# Patient Record
Sex: Male | Born: 1945 | Hispanic: No | Marital: Married | Smoking: Former smoker
Health system: Southern US, Community
[De-identification: ages and names within clinical notes are randomized; demographics above are authoritative.]

## PROBLEM LIST (undated history)

## (undated) DIAGNOSIS — R12 Heartburn: Secondary | ICD-10-CM

## (undated) DIAGNOSIS — R9431 Abnormal electrocardiogram [ECG] [EKG]: Secondary | ICD-10-CM

## (undated) DIAGNOSIS — I10 Essential (primary) hypertension: Secondary | ICD-10-CM

## (undated) DIAGNOSIS — E669 Obesity, unspecified: Secondary | ICD-10-CM

## (undated) DIAGNOSIS — E785 Hyperlipidemia, unspecified: Secondary | ICD-10-CM

## (undated) HISTORY — DX: Obesity, unspecified: E66.9

## (undated) HISTORY — DX: Abnormal electrocardiogram (ECG) (EKG): R94.31

## (undated) HISTORY — DX: Hyperlipidemia, unspecified: E78.5

## (undated) HISTORY — PX: OTHER SURGICAL HISTORY: SHX169

## (undated) HISTORY — DX: Heartburn: R12

## (undated) HISTORY — PX: LASIK: SHX215

## (undated) HISTORY — DX: Essential (primary) hypertension: I10

---

## 2009-02-20 ENCOUNTER — Encounter: Admission: RE | Admit: 2009-02-20 | Discharge: 2009-02-20 | Payer: Self-pay | Admitting: General Surgery

## 2009-02-27 ENCOUNTER — Ambulatory Visit (HOSPITAL_BASED_OUTPATIENT_CLINIC_OR_DEPARTMENT_OTHER): Admission: RE | Admit: 2009-02-27 | Discharge: 2009-02-27 | Payer: Self-pay | Admitting: General Surgery

## 2009-02-27 ENCOUNTER — Encounter (INDEPENDENT_AMBULATORY_CARE_PROVIDER_SITE_OTHER): Payer: Self-pay | Admitting: General Surgery

## 2010-09-25 IMAGING — CR DG CHEST 2V
2 series · 2 of 2 positions shown · non-contrast
Comparison: None

CLINICAL DATA: Preop for surgery on inflamed pilonidal cyst

CHEST - 2 VIEW

[w chest pa]
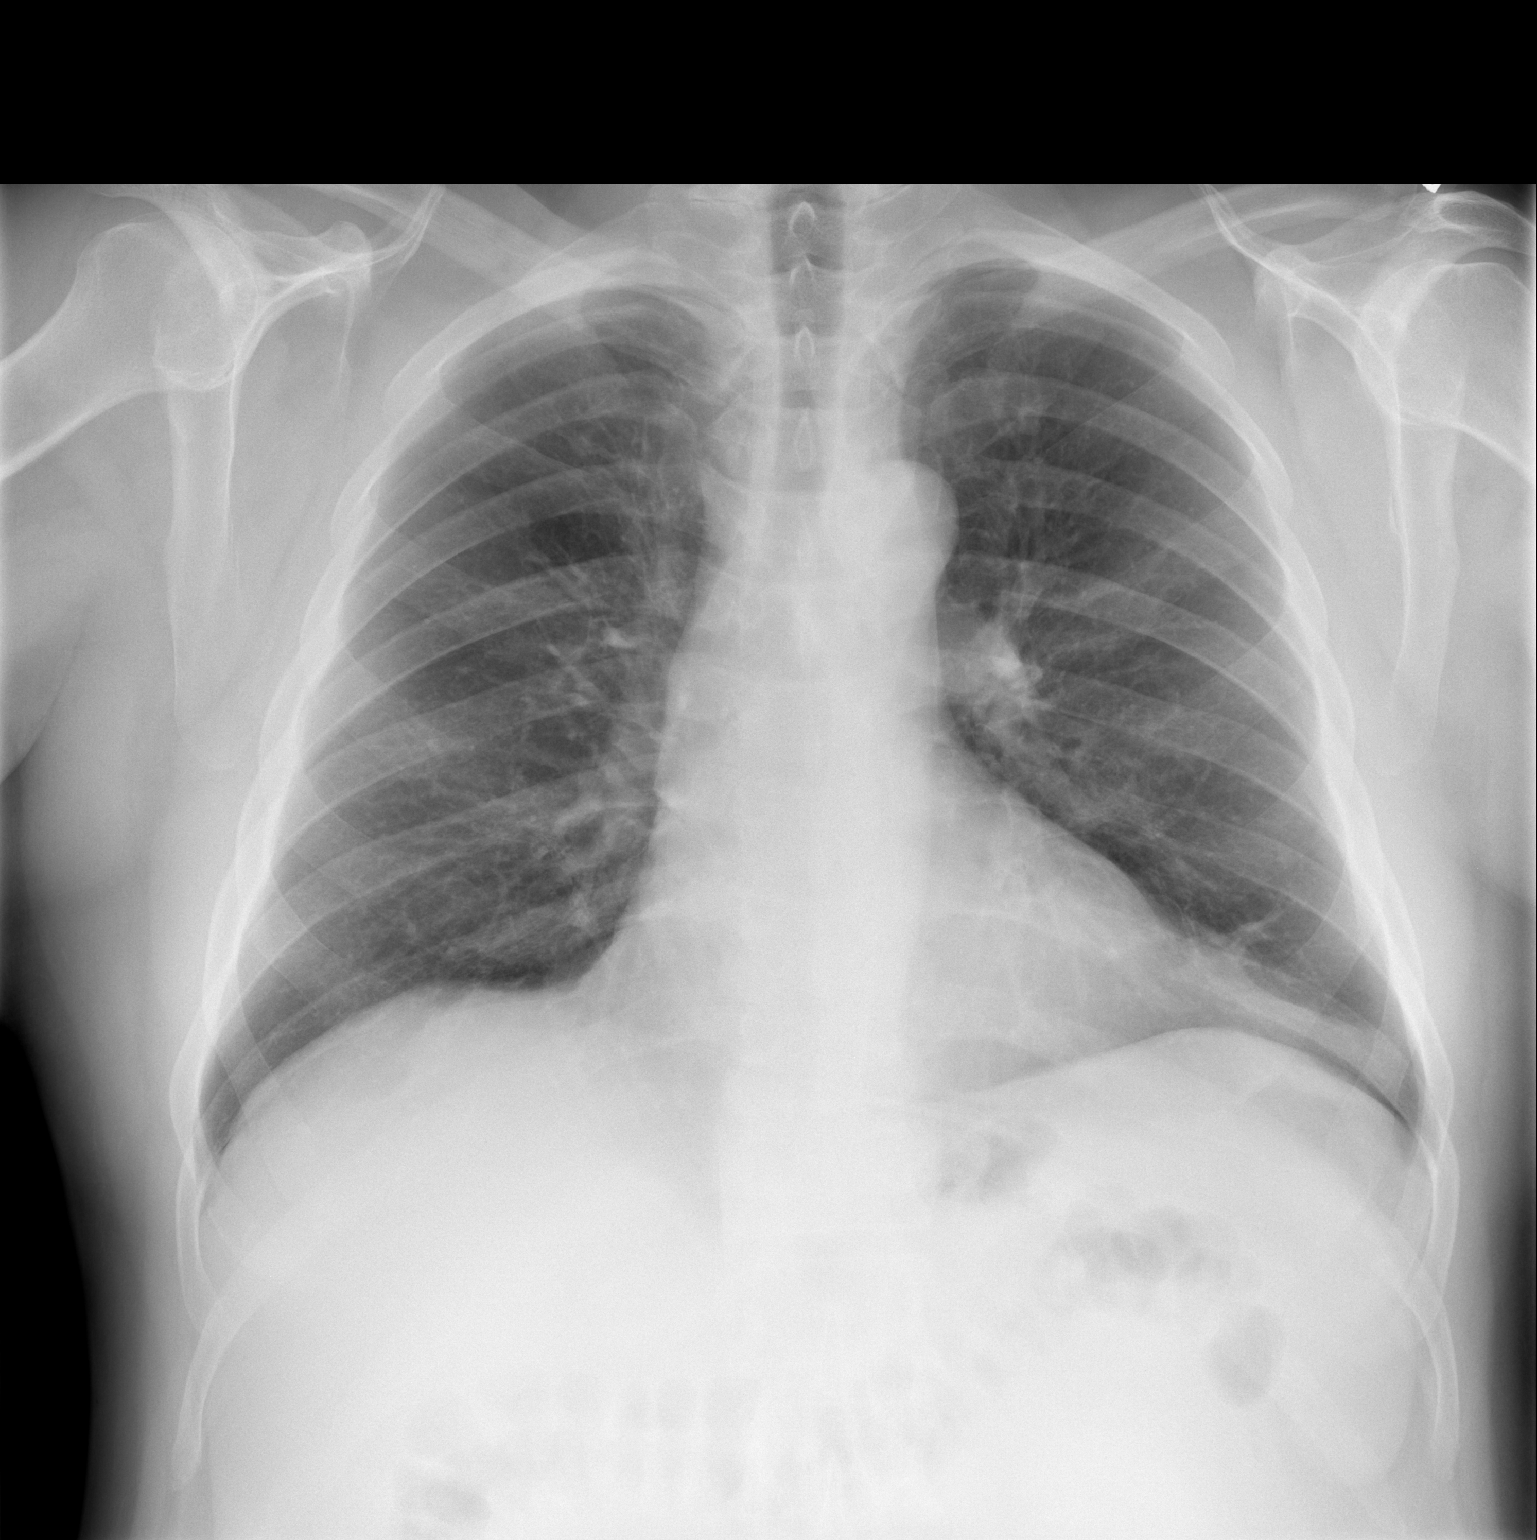

[w chest lat]
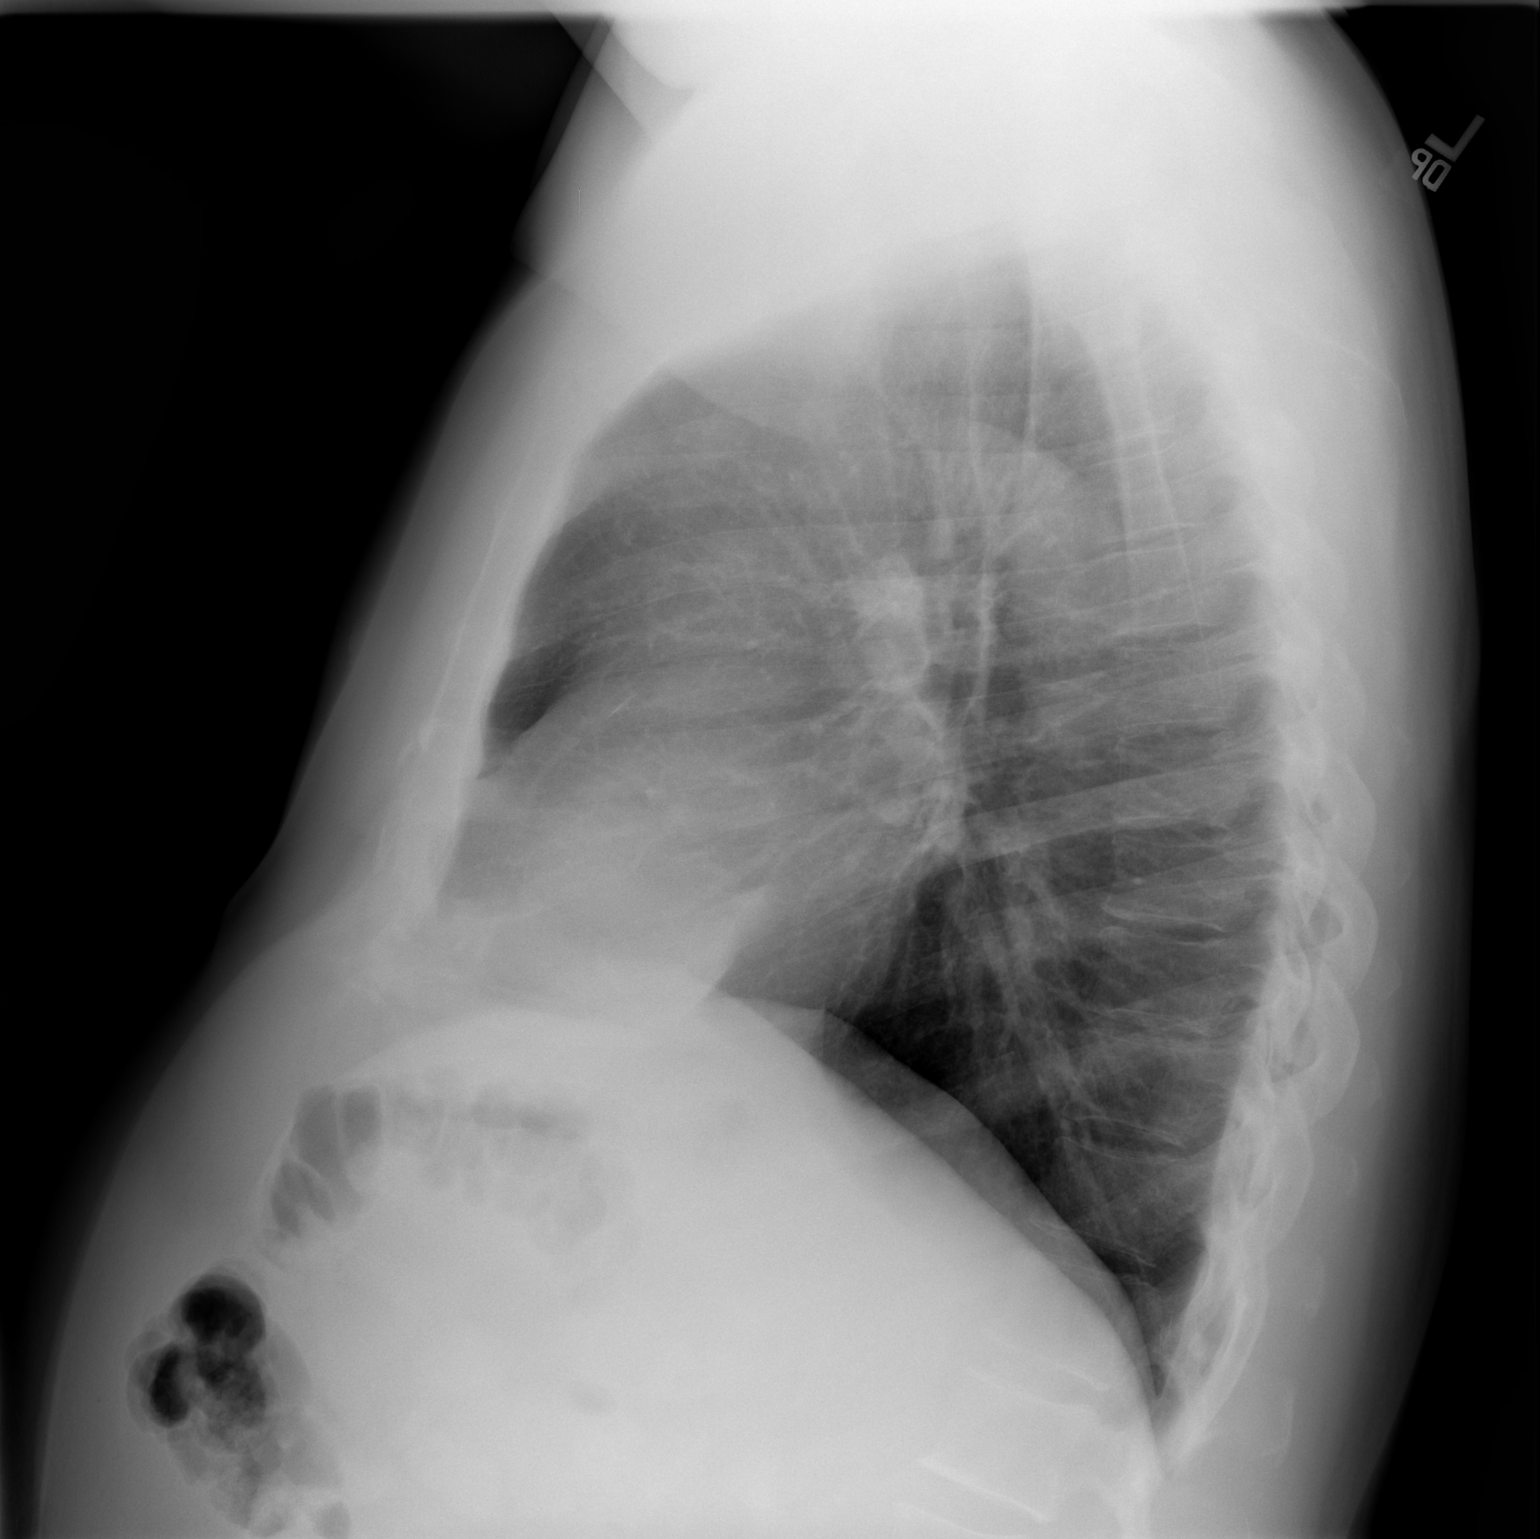

[2 of 2 positions shown; findings below may reference images not displayed]

FINDINGS: The lungs are clear. The heart is within normal limits in
size. No bony abnormality is seen.
IMPRESSION: No active lung disease.

## 2011-01-19 LAB — COMPREHENSIVE METABOLIC PANEL
ALT: 24 U/L (ref 0–53)
AST: 24 U/L (ref 0–37)
Albumin: 4 g/dL (ref 3.5–5.2)
CO2: 25 mEq/L (ref 19–32)
Chloride: 104 mEq/L (ref 96–112)
GFR calc Af Amer: >60 mL/min (ref >60)
GFR calc non Af Amer: >60 mL/min (ref >60)
Sodium: 136 mEq/L (ref 135–145)
Total Bilirubin: 0.6 mg/dL (ref 0.3–1.2)

## 2011-01-19 LAB — CBC
RBC: 4.78 MIL/uL (ref 4.22–5.81)
WBC: 6.6 10*3/uL (ref 4.0–10.5)

## 2011-01-19 LAB — DIFFERENTIAL
Basophils Absolute: 0 10*3/uL (ref 0.0–0.1)
Eosinophils Absolute: 0.2 10*3/uL (ref 0.0–0.7)
Eosinophils Relative: 3 % (ref 0–5)
Lymphs Abs: 2.4 10*3/uL (ref 0.7–4.0)
Monocytes Absolute: 0.5 10*3/uL (ref 0.1–1.0)

## 2011-02-23 NOTE — Op Note (Signed)
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   PROCEDURE:  Extensive pilonidal cystectomy.   SURGEON:  Adolph Pollack, M.D.   ANESTHESIA:  General plus Marcaine local.   INDICATIONS FOR PROCEDURE:  This is a 65 year old male who has had  recurring infections of pilonidal cyst.  He has had multiple incisions  and drainages.  He now presents for extensive pilonidal cystectomy.  The  procedure, risks and aftercare were explained to her preoperatively.   DESCRIPTION OF PROCEDURE:  He was brought to the operating room supine  on the stretcher was given a general anesthetic.  He was then turned  prone on the operating table with padding placed appropriately.  The  hair in the buttock area was clipped and the buttocks were retracted  laterally, exposing the diseased pilonidal area.  That area was  sterilely prepped and draped.   There was some purulent drainage and a firm indurated area to the right  of the gluteal cleft.  A large elliptical incision was made through the  skin and subcutaneous tissue, until I reached soft subcutaneous tissue.  This was carried down through subcutaneous tissue, all the away to the  to the border of the gluteus maximus muscle on the right, as the  induration went that far.  I used cautery to dissect down into  the  subcutaneous tissue to the presacral fascia, and then dissected this  large piece of inflamed and infected tissue free from the presacral  fascia and sent it to  pathology.  There was soft subcutaneous tissue,  without any evidence of further infection or inflammation noted.   I controlled bleeding with electrocautery and irrigated the wound and  waited and noted no further bleeding.  Local anesthetic consisting of  0.5% plain Marcaine was infiltrated superficially and deep.  The wound  was then packed tightly with saline-moistened Kerlix, followed by a  bulky dressing.   He was subsequently turned supine, extubated and taken to recovery room  in satisfactory condition.  There were no apparent complications.     Adolph Pollack, M.D.  Electronically Signed    TJR/MEDQ  D:  02/27/2009  T:  02/27/2009  Job:  [id number]

## 2013-07-19 ENCOUNTER — Encounter: Payer: Self-pay | Admitting: Interventional Cardiology

## 2013-07-24 ENCOUNTER — Encounter: Payer: Self-pay | Admitting: Interventional Cardiology

## 2013-07-24 ENCOUNTER — Ambulatory Visit (INDEPENDENT_AMBULATORY_CARE_PROVIDER_SITE_OTHER): Payer: Medicare Other | Admitting: Interventional Cardiology

## 2013-07-24 VITALS — BP 172/92 | HR 80 | Ht 69.25 in | Wt 252.0 lb

## 2013-07-24 DIAGNOSIS — Z79899 Other long term (current) drug therapy: Secondary | ICD-10-CM

## 2013-07-24 DIAGNOSIS — I10 Essential (primary) hypertension: Secondary | ICD-10-CM | POA: Insufficient documentation

## 2013-07-24 DIAGNOSIS — Z0389 Encounter for observation for other suspected diseases and conditions ruled out: Secondary | ICD-10-CM

## 2013-07-24 DIAGNOSIS — E669 Obesity, unspecified: Secondary | ICD-10-CM | POA: Insufficient documentation

## 2013-07-24 DIAGNOSIS — R9431 Abnormal electrocardiogram [ECG] [EKG]: Secondary | ICD-10-CM

## 2013-07-24 MED ORDER — LISINOPRIL 10 MG PO TABS: 10.0000 mg | ORAL_TABLET | Freq: Every day | ORAL | Status: DC

## 2013-07-24 NOTE — Patient Instructions (Signed)
Your physician has requested that you have an echocardiogram. Echocardiography is a painless test that uses sound waves to create images of your heart. It provides your doctor with information about the size and shape of your heart and how well your heart's chambers and valves are working. This procedure takes approximately one hour. There are no restrictions for this procedure.  Your physician recommends that you return for lab work in 1 week for Bmet.  Your physician has recommended you make the following change in your medication:   1.Start Lisinopril 10mg  1 tablet by mouth Daily.   Your physician recommends that you schedule a follow-up appointment in: 1 month with Dr. Eldridge Dace.

## 2013-07-24 NOTE — Progress Notes (Signed)
Patient ID: Darius Lane, male   DOB: Aug 08, 1946, 67 y.o.   MRN: [id number] Referring Physician  Dr. Miguel Aschoff,   Primary Cardiologist  Antwione Picotte-new Reason for Consultation  Abnormal ECG  HPI: 67 y/o who had an abnormal ecg several months ago.   He has felt well.  He hunts and walks several miles a week.  He walks with a large knapsack on as well.  He does not have to walk stairs.  He gets [facility] with walking hills.  He has gained about 50 lbs after stopping smoking a year ago.    No CP.  SHOB present.  He stopped cold [place], by distracting himself.  No dizziness, syncpoe, palpitations.  Edema at the end of the day.       PMH:   Past Medical History  Diagnosis Date  . Hyperlipidemia   . Heart burn      PSH:   Past Surgical History  Procedure Laterality Date  . Lasik    . Pilonidal      cyst  . Left knee      meniscal tear    Allergies:  Review of patient's allergies indicates no known allergies. Prior to Admit Meds:   (Not in a hospital admission) Fam HX:    Family History  Problem Relation Age of Onset  . Diabetes Mother   . Heart attack Father   . CAD Father    Social HX:    History   Social History  . Marital Status: Married    Spouse Name: N/A    Number of Children: N/A  . Years of Education: N/A   Occupational History  . Not on file.   Social History Main Topics  . Smoking status: Former Games developer  . Smokeless tobacco: Not on file  . Alcohol Use: Yes  . Drug Use: Not on file  . Sexual Activity: Not on file   Other Topics Concern  . Not on file   Social History Narrative  . No narrative on file     ROS:  All 11 ROS were addressed and are negative except what is stated in the HPI  Physical Exam: Blood pressure 172/92, pulse 80, height 5' 9.25" (1.759 m), weight 252 lb (114.306 kg).    General: Well developed, well nourished, in no acute distress Head: Eyes PERRLA, No xanthomas.   Normal cephalic and atramatic  Lungs:   Clear bilaterally to  auscultation and percussion. Heart:   HRRR S1 S2 Pulses are 2+ & equal.            No carotid bruit. No JVD.  No abdominal bruits. No femoral bruits. Abdomen: Bowel sounds are positive, abdomen soft and non-tender without masses or                  Hernia's noted. Msk:  Back normal, normal gait. Normal strength and tone for age. Extremities:   No  edema.  DP +1 Neuro: Alert and oriented X 3. Psych:  Normal affect, responds appropriately    Labs:   Lab Results  Component Value Date   WBC 6.6 02/20/2009   HGB 14.0 02/20/2009   HCT 42.0 02/20/2009   MCV 87.8 02/20/2009   PLT 188 02/20/2009   No results found for this basename: NA, K, CL, CO2, BUN, CREATININE, CALCIUM, LABALBU, PROT, BILITOT, ALKPHOS, ALT, AST, GLUCOSE,  in the last 168 hours No results found for this basename: PTT   No results found for this basename: INR,  PROTIME   No results found for this basename: CKTOTAL, CKMB, CKMBINDEX, TROPONINI     No results found for this basename: CHOL   No results found for this basename: HDL   No results found for this basename: LDLCALC   No results found for this basename: TRIG   No results found for this basename: CHOLHDL   No results found for this basename: LDLDIRECT      Radiology:  No results found.  EKG:    ASSESSMENT: Abnormal ECG, HTN, obesity, hyperlipidemia  PLAN:  Abnormal ECG: Plan for Echo to look at wall motion abnormalities.  COntinue aspirin. ECG shows inferior Q waves which could represent prior inferior MI. No chest discomfort with exertion at this time. Would hold off on stress testing.  HTN: start lisinopril 10 mg daily. Blood pressure has been elevated on several visits. Will check electrolytes in a week.  Hyperlipidemai: COntinue lipitor. He has had improvement with statin.  Obesity: He has gained weight since stopping smoking. He would benefit from weight loss. After this workup, I would like for him to try to get at least 150 minutes a week of  exercise. We spoke at length about dietary changes including avoiding sugary soft drinks and carbohydrate rich foods.  Corky Crafts., MD  07/24/2013  9:46 AM

## 2013-07-31 ENCOUNTER — Other Ambulatory Visit: Payer: Medicare Other

## 2013-07-31 DIAGNOSIS — Z79899 Other long term (current) drug therapy: Secondary | ICD-10-CM

## 2013-07-31 LAB — BASIC METABOLIC PANEL WITH GFR
Calcium: 9.5 mg/dL (ref 8.4–10.5)
Creat: 1.29 mg/dL (ref 0.50–1.35)
GFR, Est African American: 66 mL/min
Glucose, Bld: 92 mg/dL (ref 70–99)
Sodium: 135 mEq/L (ref 135–145)

## 2013-08-07 ENCOUNTER — Other Ambulatory Visit (HOSPITAL_COMMUNITY): Payer: Self-pay | Admitting: Interventional Cardiology

## 2013-08-07 ENCOUNTER — Other Ambulatory Visit: Payer: Self-pay

## 2013-08-07 ENCOUNTER — Ambulatory Visit (HOSPITAL_COMMUNITY): Payer: Medicare Other | Attending: Cardiology | Admitting: Radiology

## 2013-08-07 DIAGNOSIS — E785 Hyperlipidemia, unspecified: Secondary | ICD-10-CM | POA: Insufficient documentation

## 2013-08-07 DIAGNOSIS — E669 Obesity, unspecified: Secondary | ICD-10-CM | POA: Insufficient documentation

## 2013-08-07 DIAGNOSIS — R9431 Abnormal electrocardiogram [ECG] [EKG]: Secondary | ICD-10-CM | POA: Insufficient documentation

## 2013-08-07 NOTE — Progress Notes (Signed)
Echocardiogram performed.  

## 2013-08-28 ENCOUNTER — Ambulatory Visit (INDEPENDENT_AMBULATORY_CARE_PROVIDER_SITE_OTHER): Payer: Medicare Other | Admitting: Interventional Cardiology

## 2013-08-28 ENCOUNTER — Encounter: Payer: Self-pay | Admitting: Interventional Cardiology

## 2013-08-28 VITALS — BP 140/88 | HR 92 | Ht 69.25 in | Wt 242.0 lb

## 2013-08-28 DIAGNOSIS — E669 Obesity, unspecified: Secondary | ICD-10-CM

## 2013-08-28 DIAGNOSIS — E782 Mixed hyperlipidemia: Secondary | ICD-10-CM

## 2013-08-28 DIAGNOSIS — I10 Essential (primary) hypertension: Secondary | ICD-10-CM

## 2013-08-28 DIAGNOSIS — R9431 Abnormal electrocardiogram [ECG] [EKG]: Secondary | ICD-10-CM

## 2013-08-28 NOTE — Patient Instructions (Signed)
Your physician recommends that you schedule a follow-up appointment as needed  

## 2013-08-28 NOTE — Progress Notes (Signed)
Patient ID: Darius Lane, male   DOB: [birth year], 67 y.o.   MRN: [id number] Patient ID: Darius Lane, male   DOB: 1946-02-06, 67 y.o.   MRN: [id number]    [address] [place], [place]  [address] Phone: [phone] Fax:  [phone]  Date:  08/28/2013   ID:  Darius Lane, DOB 25-Jul-1946, MRN [id number]  PCP:   Duane Lope, MD      History of Present Illness: Darius Lane is a 67 y.o. male who had an abnormal ecg several months ago.   He has felt well.  He hunts and walks several miles a week.  He walks with a large knapsack on as well.  He does not have to walk stairs.  He gets [facility] with walking hills.  He has gained about 50 lbs after stopping smoking a year ago.    No CP.  SHOB present.  He stopped cold [place], by distracting himself.  No dizziness, syncpoe, palpitations.  Edema at the end of the day.    His BP was up.  Lisinopril was started but BP states in the 150-170 range systolic.  He had bronchitis and saw Dr. Tenny Craw yesterday.  BP medicine was changed to amlodipine/benazepril.  BP was better yesterday.     Wt Readings from Last 3 Encounters:  08/28/13 242 lb (109.77 kg)  07/24/13 252 lb (114.306 kg)     Past Medical History  Diagnosis Date  . Hyperlipidemia   . Heart burn   . Essential hypertension, benign   . Nonspecific abnormal electrocardiogram (ECG) (EKG)     Wide QRS with inferior Q waves  . Obesity, unspecified     Current Outpatient Prescriptions  Medication Sig Dispense Refill  . amLODipine-benazepril (LOTREL) 5-20 MG per capsule Take 1 capsule by mouth daily.       Marland Kitchen aspirin 81 MG tablet Take 81 mg by mouth daily.      Marland Kitchen atorvastatin (LIPITOR) 40 MG tablet Take 40 mg by mouth daily.      Marland Kitchen azithromycin (ZITHROMAX) 250 MG tablet Take 250 mg by mouth once.       . ranitidine (ZANTAC) 150 MG tablet Take 150 mg by mouth 2 (two) times daily.        No current facility-administered medications for this visit.    Allergies:   No Known  Allergies  Social History:  The patient  reports that he has quit smoking. He does not have any smokeless tobacco history on file. He reports that he drinks alcohol.   Family History:  The patient's family history includes CAD in his father; Diabetes in his mother; Heart attack in his father.   ROS:  Please see the history of present illness.  No nausea, vomiting.  No fevers, chills.  No focal weakness.  No dysuria.    All other systems reviewed and negative.   PHYSICAL EXAM: VS:  BP 140/88  Pulse 92  Ht 5' 9.25" (1.759 m)  Wt 242 lb (109.77 kg)  BMI 35.48 kg/m2 Well nourished, well developed, in no acute distress HEENT: normal Neck: no JVD, no carotid bruits Cardiac:  normal S1, S2; RRR;  Lungs:  clear to auscultation bilaterally, no wheezing, rhonchi or rales Abd: soft, nontender, no hepatomegaly Ext: no edema Skin: warm and dry Neuro:   no focal abnormalities noted  EKG:      ASSESSMENT AND PLAN:  Abnormal ECG:  Echo showed no RWMA. COntinue aspirin. ECG shows  inferior Q waves. No chest discomfort with exertion at this time. Would hold off on stress testing.   HTN:  lisinopril 10 mg daily gave no significant BP reduction. Blood pressure has been elevated on several visits. Continue lotrel .   Hyperlipidemia: COntinue lipitor. He has had improvement with statin. LDL target less than 130.  Obesity: He has gained weight since stopping smoking. He would benefit from weight loss. After this workup, I would like for him to try to get at least 150 minutes a week of exercise. We spoke at length about dietary changes including avoiding sugary soft drinks and carbohydrate rich foods.  He has lost 10 lbs since the last visit.   Signed, Fredric Mare, MD, [facility] 08/28/2013 11:01 AM

## 2013-08-29 DIAGNOSIS — E782 Mixed hyperlipidemia: Secondary | ICD-10-CM | POA: Insufficient documentation

## 2018-10-18 ENCOUNTER — Other Ambulatory Visit: Payer: Self-pay | Admitting: Family Medicine

## 2018-10-18 DIAGNOSIS — Z136 Encounter for screening for cardiovascular disorders: Secondary | ICD-10-CM

## 2018-10-27 ENCOUNTER — Ambulatory Visit
Admission: RE | Admit: 2018-10-27 | Discharge: 2018-10-27 | Disposition: A | Discharge status: Home / Self Care | Payer: Medicare Other | Source: Ambulatory Visit | Attending: Family Medicine | Admitting: Family Medicine

## 2018-10-27 DIAGNOSIS — Z136 Encounter for screening for cardiovascular disorders: Secondary | ICD-10-CM

## 2019-12-07 ENCOUNTER — Ambulatory Visit: Payer: Medicare Other | Attending: Internal Medicine

## 2019-12-07 DIAGNOSIS — Z23 Encounter for immunization: Secondary | ICD-10-CM

## 2019-12-07 NOTE — Progress Notes (Signed)
   Covid-19 Vaccination Clinic  Name:  Darius Lane    MRN: [id number] DOB: 19-Mar-1946  12/07/2019  [name] was observed post Covid-19 immunization for 15 minutes without incidence. He was provided with Vaccine Information Sheet and instruction to access the V-Safe system.   [name] was instructed to call 911 with any severe reactions post vaccine: Marland Kitchen Difficulty breathing  . Swelling of your face and throat  . A fast heartbeat  . A bad rash all over your body  . Dizziness and weakness    Immunizations Administered    Name Date Dose VIS Date Route   Pfizer COVID-19 Vaccine 12/07/2019 11:46 AM 0.3 mL 09/21/2019 Intramuscular   Manufacturer: [place]   Lot: [id number]   [place]: [id number]

## 2020-01-01 ENCOUNTER — Ambulatory Visit: Payer: Medicare Other | Attending: Internal Medicine

## 2020-01-01 DIAGNOSIS — Z23 Encounter for immunization: Secondary | ICD-10-CM

## 2020-01-01 NOTE — Progress Notes (Signed)
   Covid-19 Vaccination Clinic  Name:  Darius Lane    MRN: [id number] DOB: 12/06/1945  01/01/2020  Darius Lane was observed post Covid-19 immunization for 15 minutes without incident. He was provided with Vaccine Information Sheet and instruction to access the V-Safe system.   Darius Lane was instructed to call 911 with any severe reactions post vaccine: Marland Kitchen Difficulty breathing  . Swelling of face and throat  . A fast heartbeat  . A bad rash all over body  . Dizziness and weakness   Immunizations Administered    Name Date Dose VIS Date Route   Pfizer COVID-19 Vaccine 01/01/2020  2:15 PM 0.3 mL 09/21/2019 Intramuscular   Manufacturer: [place]   Lot: [id number]   [place]: [id number]

## 2020-05-31 IMAGING — US US ABDOMINAL AORTA SCREENING AAA
1 series · 14 of 14 positions shown · non-contrast
Comparison: None.

CLINICAL DATA: Male between 65-75 years of age with a smoking
history.

EXAM:
US ABDOMINAL AORTA MEDICARE SCREENING
TECHNIQUE: Ultrasound examination of the abdominal aorta was performed as a
screening evaluation for abdominal aortic aneurysm.

[Series 1: us abdominal aorta screening aaa · 0.33mm/px · 14 of 14 slices shown]
[im 1/14]
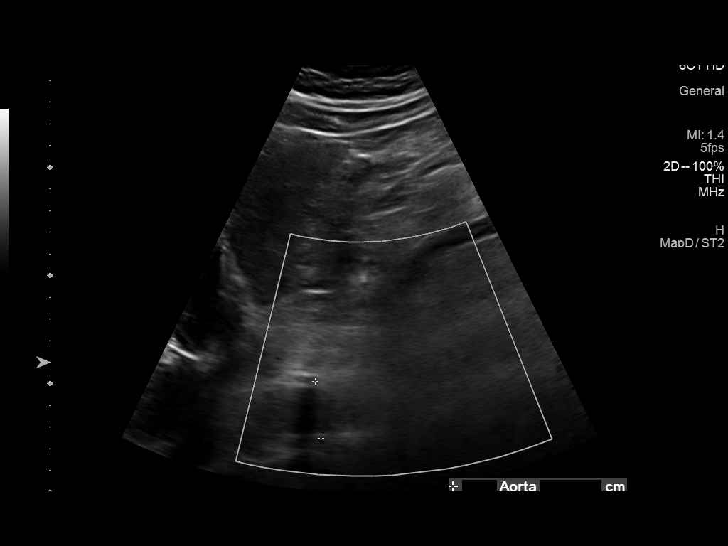
[im 2/14]
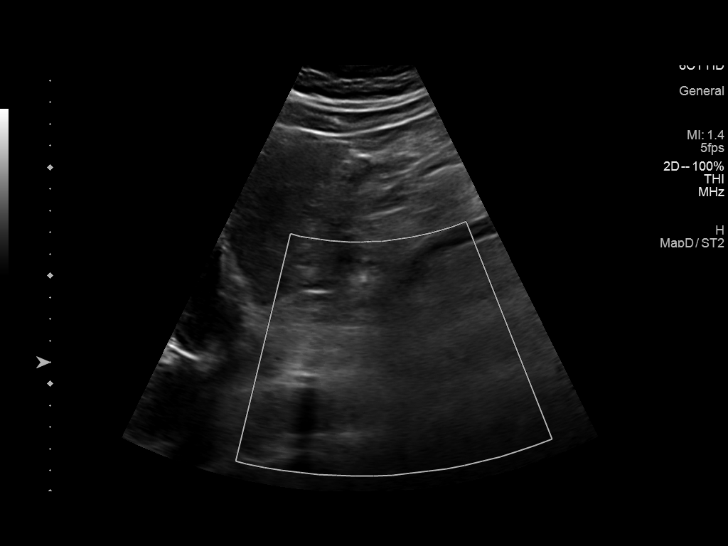
[im 3/14]
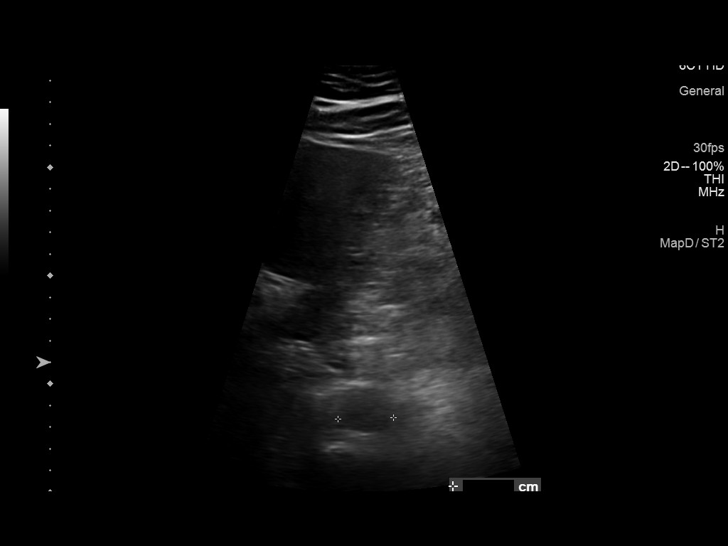
[im 4/14]
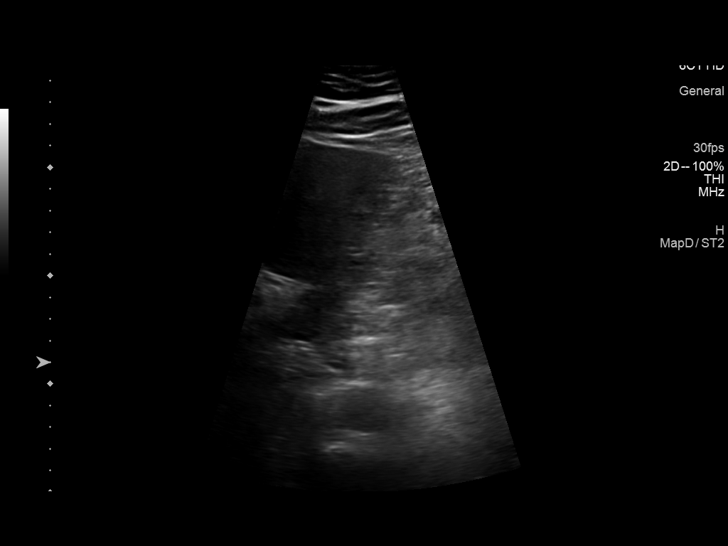
[im 5/14]
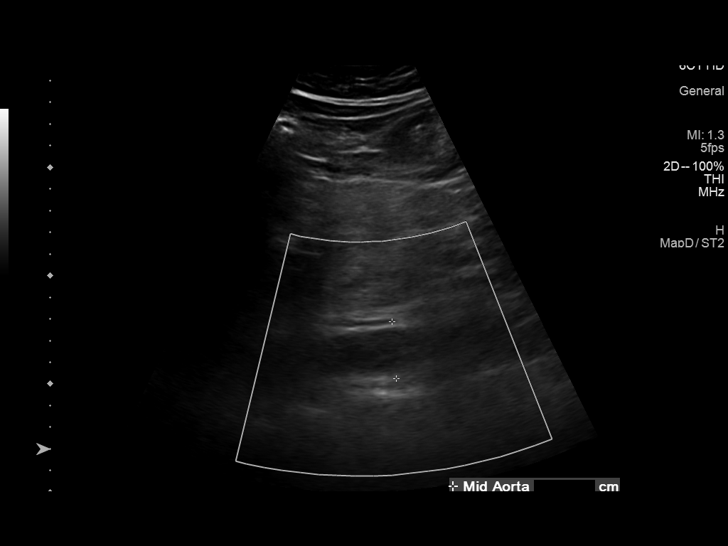
[im 6/14]
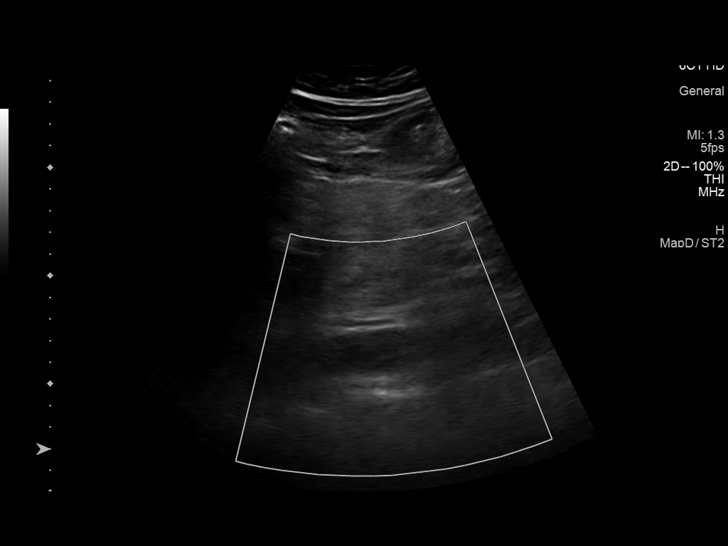
[im 7/14]
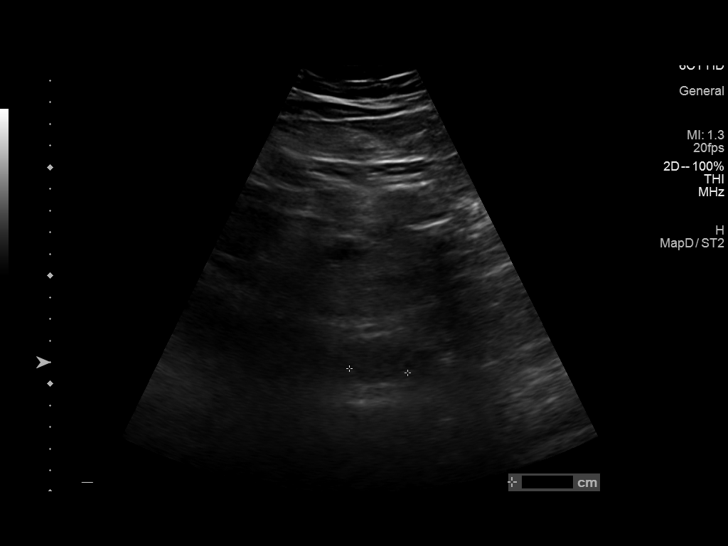
[im 8/14]
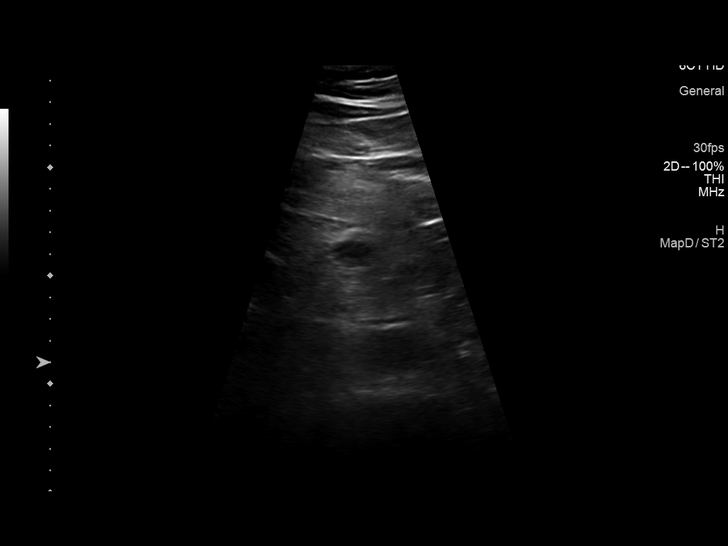
[im 9/14]
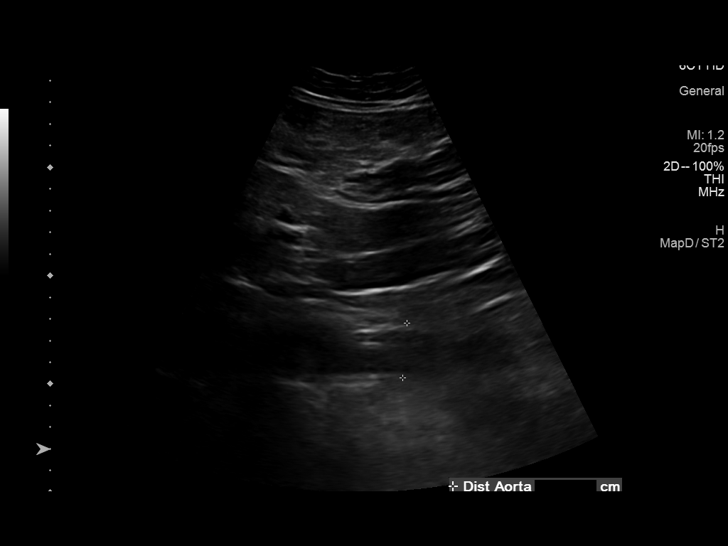
[im 10/14]
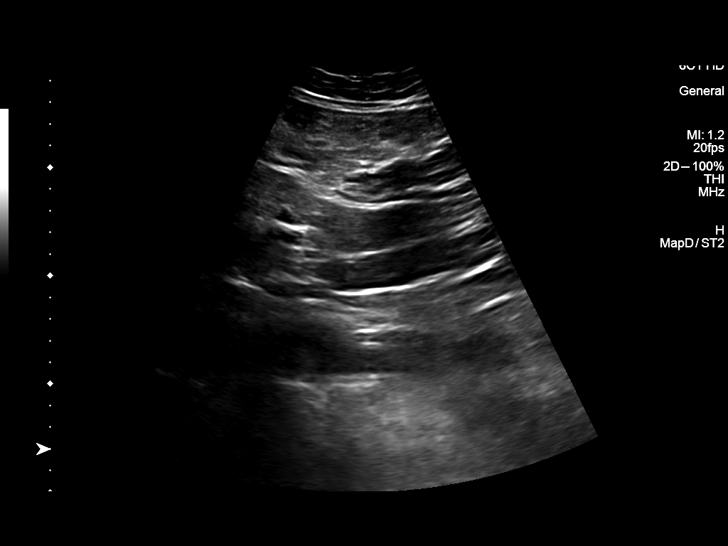
[im 11/14]
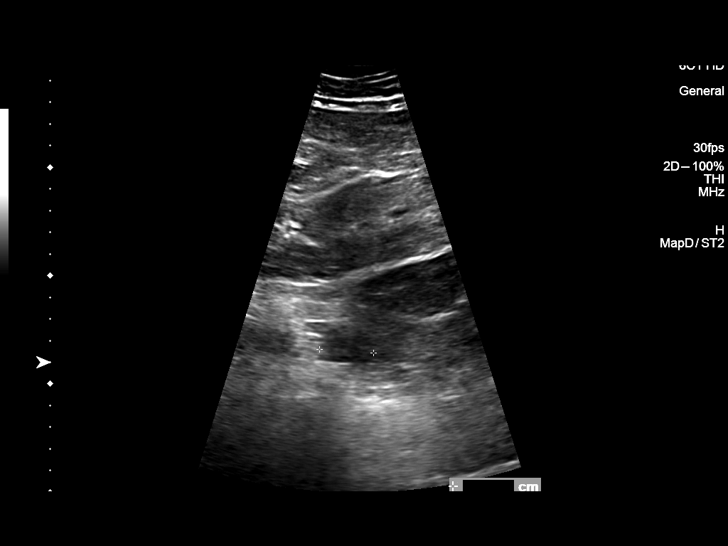
[im 12/14]
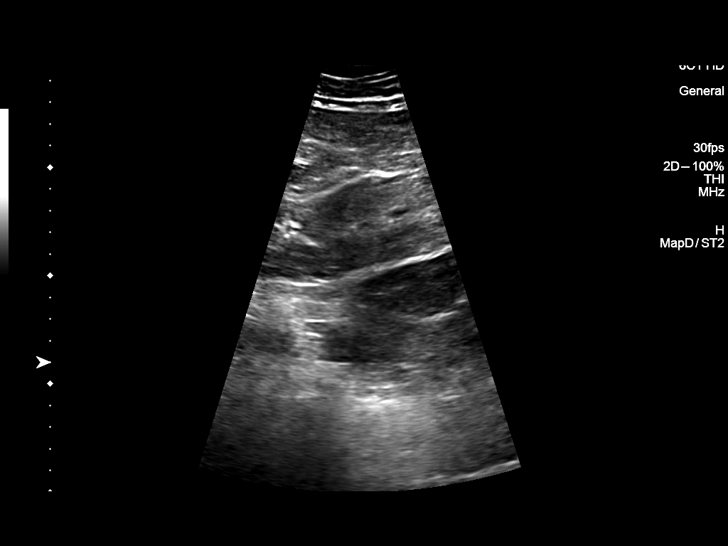
[im 13/14]
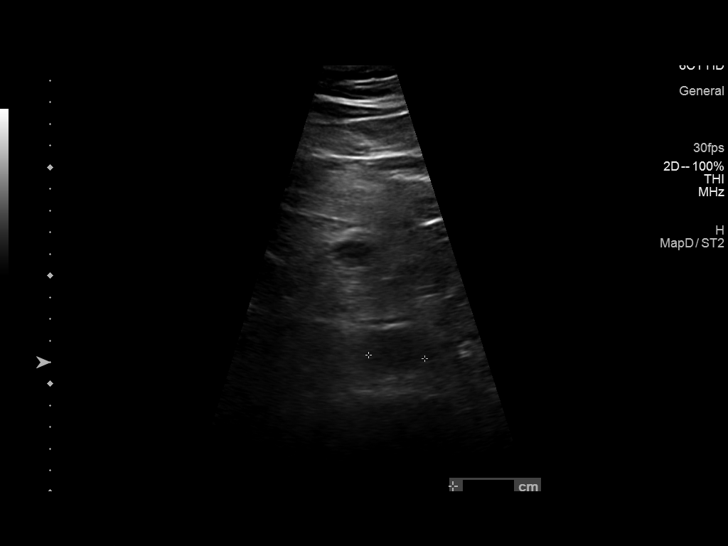
[im 14/14]
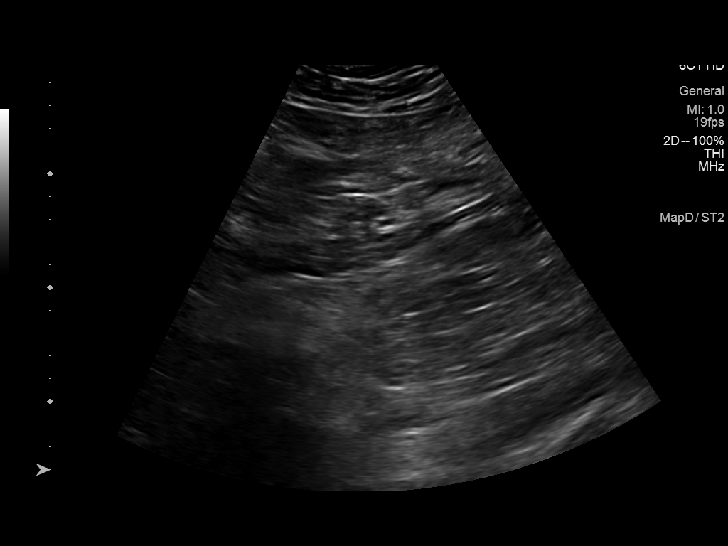

[14 of 14 positions shown; findings below may reference images not displayed]

FINDINGS: Abdominal aortic measurements as follows:

Proximal:  2.6 cm

Mid:  2.7 cm

Distal:  2.5 cm
IMPRESSION: No aneurysm identified.

## 2020-10-21 DIAGNOSIS — N183 Chronic kidney disease, stage 3 unspecified: Secondary | ICD-10-CM | POA: Diagnosis not present

## 2020-10-21 DIAGNOSIS — I10 Essential (primary) hypertension: Secondary | ICD-10-CM | POA: Diagnosis not present

## 2020-10-21 DIAGNOSIS — E7800 Pure hypercholesterolemia, unspecified: Secondary | ICD-10-CM | POA: Diagnosis not present

## 2020-11-10 DIAGNOSIS — Z0000 Encounter for general adult medical examination without abnormal findings: Secondary | ICD-10-CM | POA: Diagnosis not present

## 2020-11-10 DIAGNOSIS — E7800 Pure hypercholesterolemia, unspecified: Secondary | ICD-10-CM | POA: Diagnosis not present

## 2020-11-10 DIAGNOSIS — N183 Chronic kidney disease, stage 3 unspecified: Secondary | ICD-10-CM | POA: Diagnosis not present

## 2020-11-10 DIAGNOSIS — I10 Essential (primary) hypertension: Secondary | ICD-10-CM | POA: Diagnosis not present

## 2020-11-19 DIAGNOSIS — I10 Essential (primary) hypertension: Secondary | ICD-10-CM | POA: Diagnosis not present

## 2020-11-19 DIAGNOSIS — N183 Chronic kidney disease, stage 3 unspecified: Secondary | ICD-10-CM | POA: Diagnosis not present

## 2020-11-19 DIAGNOSIS — E7800 Pure hypercholesterolemia, unspecified: Secondary | ICD-10-CM | POA: Diagnosis not present

## 2020-12-12 DIAGNOSIS — H524 Presbyopia: Secondary | ICD-10-CM | POA: Diagnosis not present

## 2020-12-12 DIAGNOSIS — H43813 Vitreous degeneration, bilateral: Secondary | ICD-10-CM | POA: Diagnosis not present

## 2020-12-12 DIAGNOSIS — H35371 Puckering of macula, right eye: Secondary | ICD-10-CM | POA: Diagnosis not present

## 2020-12-12 DIAGNOSIS — H2513 Age-related nuclear cataract, bilateral: Secondary | ICD-10-CM | POA: Diagnosis not present

## 2020-12-18 DIAGNOSIS — N183 Chronic kidney disease, stage 3 unspecified: Secondary | ICD-10-CM | POA: Diagnosis not present

## 2020-12-18 DIAGNOSIS — E7800 Pure hypercholesterolemia, unspecified: Secondary | ICD-10-CM | POA: Diagnosis not present

## 2020-12-18 DIAGNOSIS — I10 Essential (primary) hypertension: Secondary | ICD-10-CM | POA: Diagnosis not present

## 2021-01-01 DIAGNOSIS — H2512 Age-related nuclear cataract, left eye: Secondary | ICD-10-CM | POA: Diagnosis not present

## 2021-01-01 DIAGNOSIS — H25812 Combined forms of age-related cataract, left eye: Secondary | ICD-10-CM | POA: Diagnosis not present

## 2021-01-01 DIAGNOSIS — H25012 Cortical age-related cataract, left eye: Secondary | ICD-10-CM | POA: Diagnosis not present

## 2021-01-27 DIAGNOSIS — I10 Essential (primary) hypertension: Secondary | ICD-10-CM | POA: Diagnosis not present

## 2021-01-27 DIAGNOSIS — N183 Chronic kidney disease, stage 3 unspecified: Secondary | ICD-10-CM | POA: Diagnosis not present

## 2021-01-27 DIAGNOSIS — E7800 Pure hypercholesterolemia, unspecified: Secondary | ICD-10-CM | POA: Diagnosis not present

## 2021-02-09 DIAGNOSIS — R978 Other abnormal tumor markers: Secondary | ICD-10-CM | POA: Diagnosis not present

## 2021-02-09 DIAGNOSIS — N183 Chronic kidney disease, stage 3 unspecified: Secondary | ICD-10-CM | POA: Diagnosis not present

## 2021-02-09 DIAGNOSIS — E7800 Pure hypercholesterolemia, unspecified: Secondary | ICD-10-CM | POA: Diagnosis not present

## 2021-04-02 DIAGNOSIS — D631 Anemia in chronic kidney disease: Secondary | ICD-10-CM | POA: Diagnosis not present

## 2021-04-02 DIAGNOSIS — N1832 Chronic kidney disease, stage 3b: Secondary | ICD-10-CM | POA: Diagnosis not present

## 2021-04-02 DIAGNOSIS — N2581 Secondary hyperparathyroidism of renal origin: Secondary | ICD-10-CM | POA: Diagnosis not present

## 2021-04-02 DIAGNOSIS — I129 Hypertensive chronic kidney disease with stage 1 through stage 4 chronic kidney disease, or unspecified chronic kidney disease: Secondary | ICD-10-CM | POA: Diagnosis not present

## 2021-04-02 DIAGNOSIS — E785 Hyperlipidemia, unspecified: Secondary | ICD-10-CM | POA: Diagnosis not present

## 2021-04-03 ENCOUNTER — Other Ambulatory Visit: Payer: Self-pay | Admitting: Nephrology

## 2021-04-03 DIAGNOSIS — I129 Hypertensive chronic kidney disease with stage 1 through stage 4 chronic kidney disease, or unspecified chronic kidney disease: Secondary | ICD-10-CM

## 2021-04-03 DIAGNOSIS — N1832 Chronic kidney disease, stage 3b: Secondary | ICD-10-CM

## 2021-04-16 ENCOUNTER — Ambulatory Visit
Admission: RE | Admit: 2021-04-16 | Discharge: 2021-04-16 | Disposition: A | Discharge status: Home / Self Care | Payer: Medicare Other | Source: Ambulatory Visit | Attending: Nephrology | Admitting: Nephrology

## 2021-04-16 DIAGNOSIS — I129 Hypertensive chronic kidney disease with stage 1 through stage 4 chronic kidney disease, or unspecified chronic kidney disease: Secondary | ICD-10-CM

## 2021-04-16 DIAGNOSIS — N2889 Other specified disorders of kidney and ureter: Secondary | ICD-10-CM | POA: Diagnosis not present

## 2021-04-16 DIAGNOSIS — N1832 Chronic kidney disease, stage 3b: Secondary | ICD-10-CM

## 2021-05-04 DIAGNOSIS — I10 Essential (primary) hypertension: Secondary | ICD-10-CM | POA: Diagnosis not present

## 2021-05-04 DIAGNOSIS — N183 Chronic kidney disease, stage 3 unspecified: Secondary | ICD-10-CM | POA: Diagnosis not present

## 2021-05-04 DIAGNOSIS — E7800 Pure hypercholesterolemia, unspecified: Secondary | ICD-10-CM | POA: Diagnosis not present

## 2021-05-05 DIAGNOSIS — N1832 Chronic kidney disease, stage 3b: Secondary | ICD-10-CM | POA: Diagnosis not present

## 2021-05-18 DIAGNOSIS — N183 Chronic kidney disease, stage 3 unspecified: Secondary | ICD-10-CM | POA: Diagnosis not present

## 2021-05-18 DIAGNOSIS — I10 Essential (primary) hypertension: Secondary | ICD-10-CM | POA: Diagnosis not present

## 2021-05-18 DIAGNOSIS — E7800 Pure hypercholesterolemia, unspecified: Secondary | ICD-10-CM | POA: Diagnosis not present

## 2021-06-16 DIAGNOSIS — E7800 Pure hypercholesterolemia, unspecified: Secondary | ICD-10-CM | POA: Diagnosis not present

## 2021-06-16 DIAGNOSIS — I10 Essential (primary) hypertension: Secondary | ICD-10-CM | POA: Diagnosis not present

## 2021-06-16 DIAGNOSIS — N183 Chronic kidney disease, stage 3 unspecified: Secondary | ICD-10-CM | POA: Diagnosis not present

## 2021-07-06 DIAGNOSIS — N189 Chronic kidney disease, unspecified: Secondary | ICD-10-CM | POA: Diagnosis not present

## 2021-07-06 DIAGNOSIS — N2581 Secondary hyperparathyroidism of renal origin: Secondary | ICD-10-CM | POA: Diagnosis not present

## 2021-07-06 DIAGNOSIS — D631 Anemia in chronic kidney disease: Secondary | ICD-10-CM | POA: Diagnosis not present

## 2021-07-06 DIAGNOSIS — E785 Hyperlipidemia, unspecified: Secondary | ICD-10-CM | POA: Diagnosis not present

## 2021-07-06 DIAGNOSIS — I129 Hypertensive chronic kidney disease with stage 1 through stage 4 chronic kidney disease, or unspecified chronic kidney disease: Secondary | ICD-10-CM | POA: Diagnosis not present

## 2021-07-06 DIAGNOSIS — N281 Cyst of kidney, acquired: Secondary | ICD-10-CM | POA: Diagnosis not present

## 2021-07-06 DIAGNOSIS — Z23 Encounter for immunization: Secondary | ICD-10-CM | POA: Diagnosis not present

## 2021-07-06 DIAGNOSIS — N1832 Chronic kidney disease, stage 3b: Secondary | ICD-10-CM | POA: Diagnosis not present

## 2021-07-14 DIAGNOSIS — I10 Essential (primary) hypertension: Secondary | ICD-10-CM | POA: Diagnosis not present

## 2021-07-14 DIAGNOSIS — N183 Chronic kidney disease, stage 3 unspecified: Secondary | ICD-10-CM | POA: Diagnosis not present

## 2021-07-14 DIAGNOSIS — E7800 Pure hypercholesterolemia, unspecified: Secondary | ICD-10-CM | POA: Diagnosis not present

## 2021-08-12 DIAGNOSIS — N183 Chronic kidney disease, stage 3 unspecified: Secondary | ICD-10-CM | POA: Diagnosis not present

## 2021-08-12 DIAGNOSIS — E7800 Pure hypercholesterolemia, unspecified: Secondary | ICD-10-CM | POA: Diagnosis not present

## 2021-08-12 DIAGNOSIS — I10 Essential (primary) hypertension: Secondary | ICD-10-CM | POA: Diagnosis not present

## 2021-09-13 DIAGNOSIS — E7800 Pure hypercholesterolemia, unspecified: Secondary | ICD-10-CM | POA: Diagnosis not present

## 2021-09-13 DIAGNOSIS — I10 Essential (primary) hypertension: Secondary | ICD-10-CM | POA: Diagnosis not present

## 2021-09-13 DIAGNOSIS — N183 Chronic kidney disease, stage 3 unspecified: Secondary | ICD-10-CM | POA: Diagnosis not present

## 2021-11-16 DIAGNOSIS — E7800 Pure hypercholesterolemia, unspecified: Secondary | ICD-10-CM | POA: Diagnosis not present

## 2021-11-16 DIAGNOSIS — I10 Essential (primary) hypertension: Secondary | ICD-10-CM | POA: Diagnosis not present

## 2021-11-19 DIAGNOSIS — E7800 Pure hypercholesterolemia, unspecified: Secondary | ICD-10-CM | POA: Diagnosis not present

## 2021-11-19 DIAGNOSIS — Z0000 Encounter for general adult medical examination without abnormal findings: Secondary | ICD-10-CM | POA: Diagnosis not present

## 2021-11-19 DIAGNOSIS — N183 Chronic kidney disease, stage 3 unspecified: Secondary | ICD-10-CM | POA: Diagnosis not present

## 2021-11-19 DIAGNOSIS — I10 Essential (primary) hypertension: Secondary | ICD-10-CM | POA: Diagnosis not present

## 2021-11-19 DIAGNOSIS — R978 Other abnormal tumor markers: Secondary | ICD-10-CM | POA: Diagnosis not present

## 2022-07-22 DIAGNOSIS — Z1211 Encounter for screening for malignant neoplasm of colon: Secondary | ICD-10-CM | POA: Diagnosis not present

## 2022-09-07 DIAGNOSIS — D3131 Benign neoplasm of right choroid: Secondary | ICD-10-CM | POA: Diagnosis not present

## 2022-09-07 DIAGNOSIS — H5213 Myopia, bilateral: Secondary | ICD-10-CM | POA: Diagnosis not present

## 2022-09-07 DIAGNOSIS — H43813 Vitreous degeneration, bilateral: Secondary | ICD-10-CM | POA: Diagnosis not present

## 2022-09-07 DIAGNOSIS — H2511 Age-related nuclear cataract, right eye: Secondary | ICD-10-CM | POA: Diagnosis not present

## 2022-09-07 DIAGNOSIS — H35371 Puckering of macula, right eye: Secondary | ICD-10-CM | POA: Diagnosis not present

## 2022-09-07 DIAGNOSIS — H524 Presbyopia: Secondary | ICD-10-CM | POA: Diagnosis not present

## 2022-09-07 DIAGNOSIS — H25011 Cortical age-related cataract, right eye: Secondary | ICD-10-CM | POA: Diagnosis not present

## 2022-09-23 DIAGNOSIS — H269 Unspecified cataract: Secondary | ICD-10-CM | POA: Diagnosis not present

## 2022-09-23 DIAGNOSIS — H25011 Cortical age-related cataract, right eye: Secondary | ICD-10-CM | POA: Diagnosis not present

## 2022-09-23 DIAGNOSIS — H25811 Combined forms of age-related cataract, right eye: Secondary | ICD-10-CM | POA: Diagnosis not present

## 2022-09-23 DIAGNOSIS — H2511 Age-related nuclear cataract, right eye: Secondary | ICD-10-CM | POA: Diagnosis not present

## 2022-10-29 DIAGNOSIS — H35371 Puckering of macula, right eye: Secondary | ICD-10-CM | POA: Diagnosis not present

## 2022-11-19 IMAGING — US US RENAL ARTERY STENOSIS
1 series · 13 of 25 positions shown · non-contrast
Comparison: None.

CLINICAL DATA: 74-year-old male with a history of chronic renal
disease

EXAM:
RENAL/URINARY TRACT ULTRASOUND
RENAL DUPLEX ULTRASOUND

[Series 1: us renal artery stenosis · 0.26mm/px · 13 of 85 slices shown]
[im 1/85]
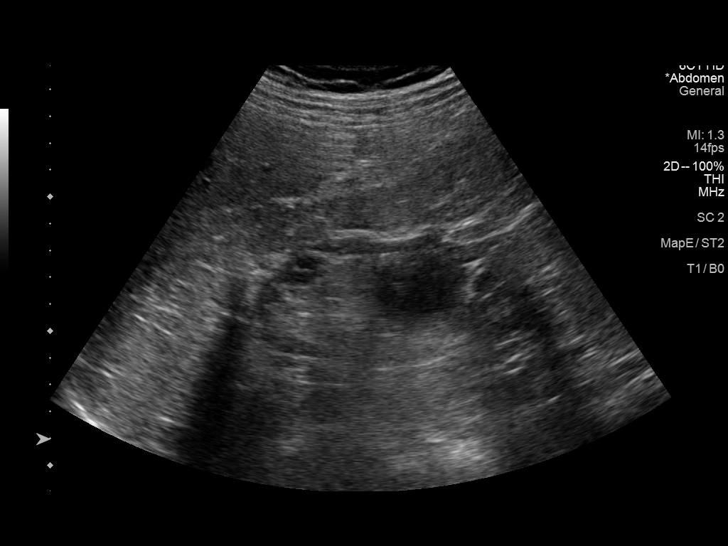
[im 8/85]
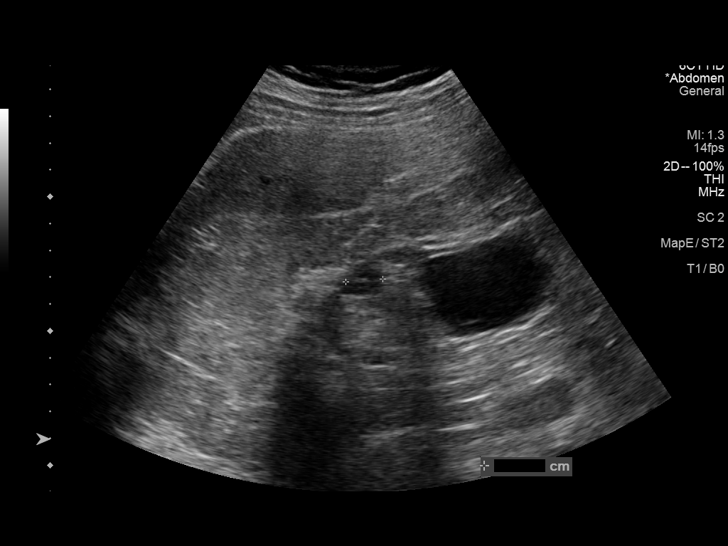
[im 15/85]
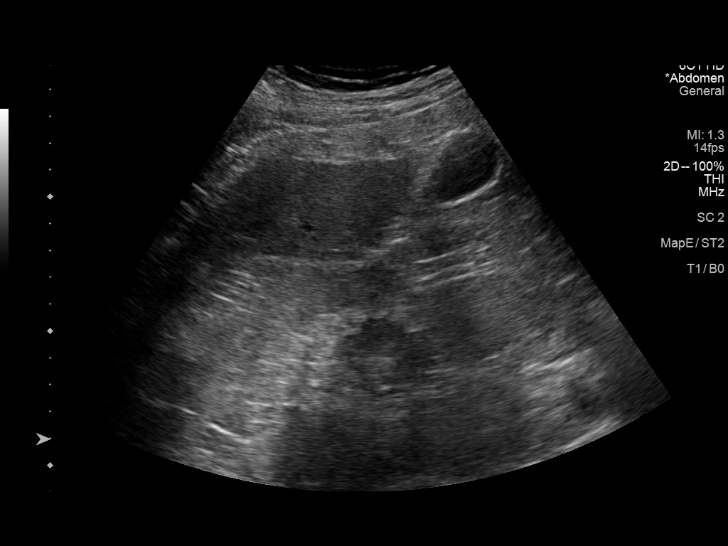
[im 22/85]
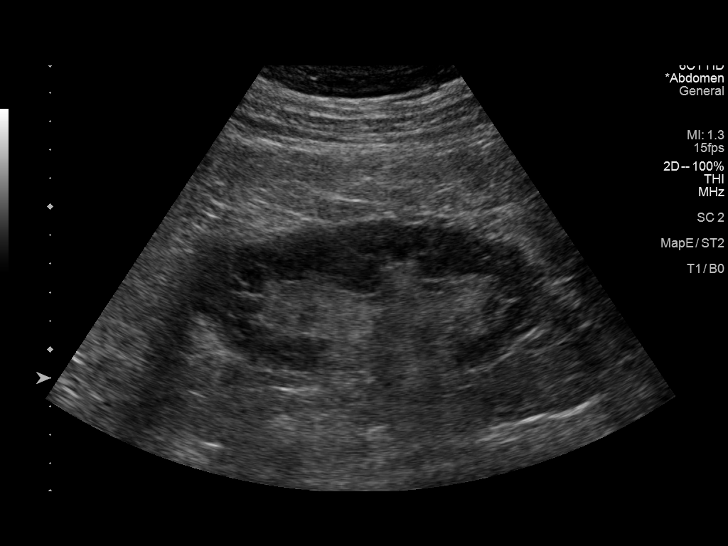
[im 29/85]
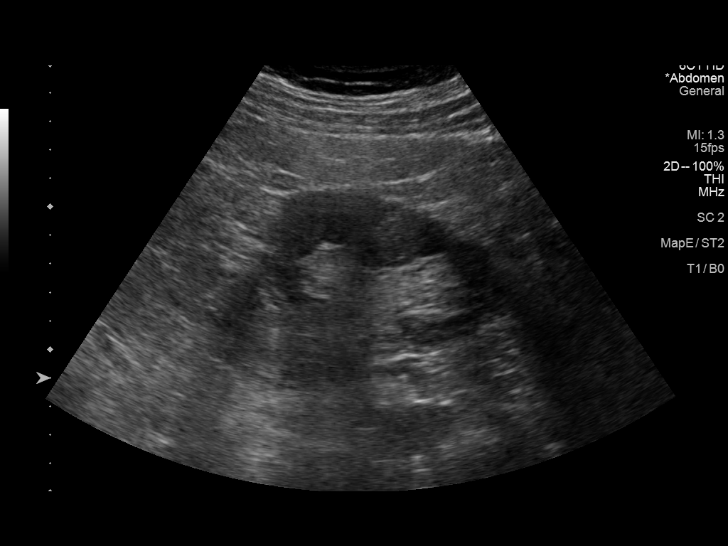
[im 36/85]
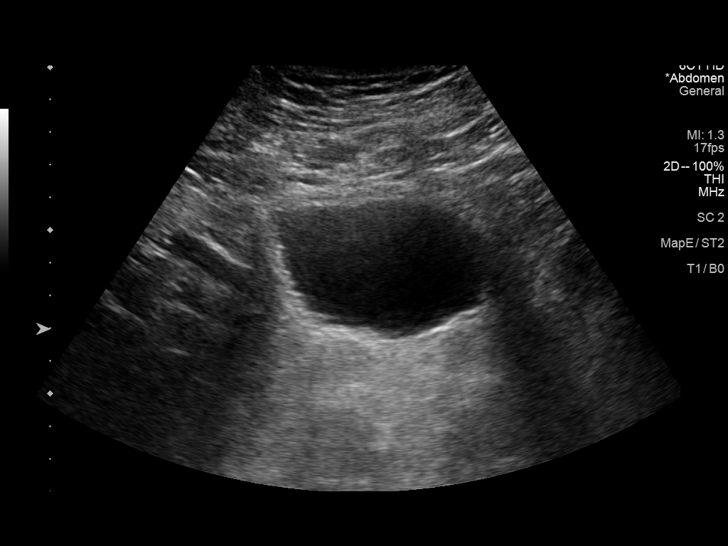
[im 43/85]
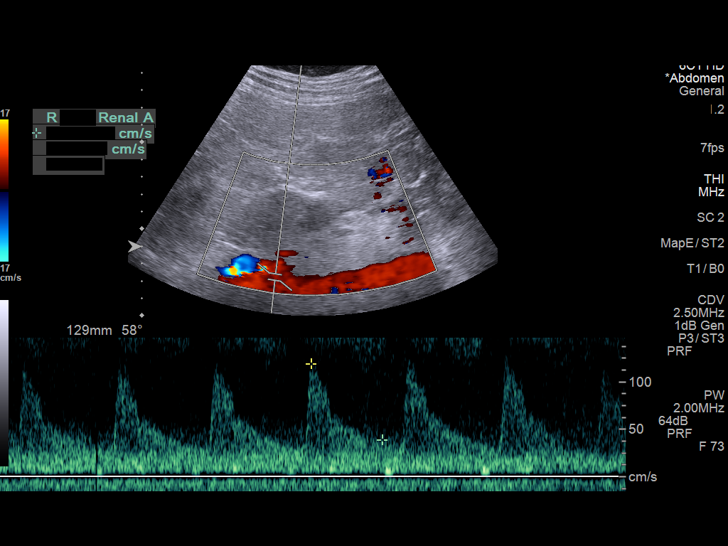
[im 50/85]
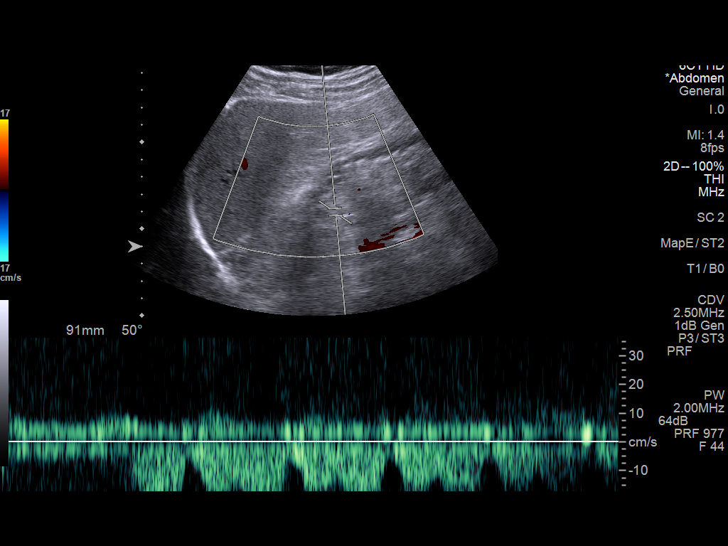
[im 57/85]
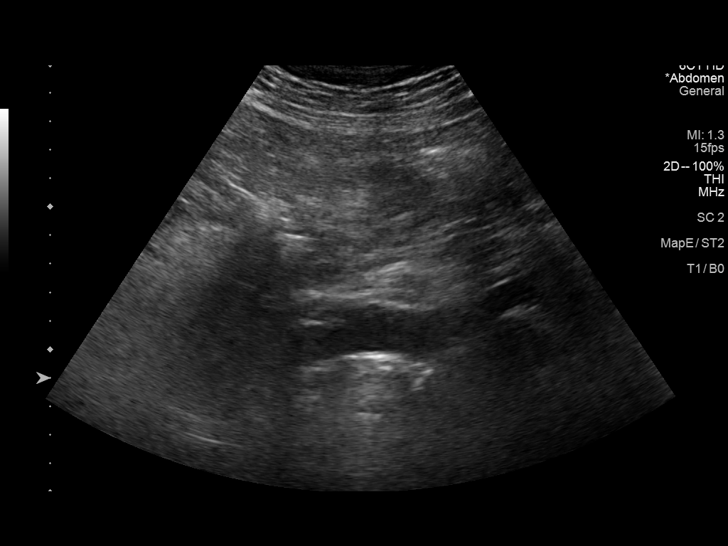
[im 64/85]
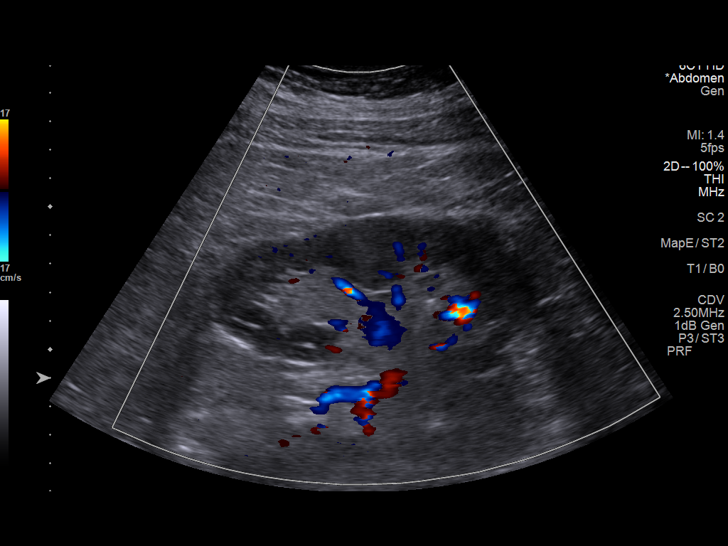
[im 71/85]
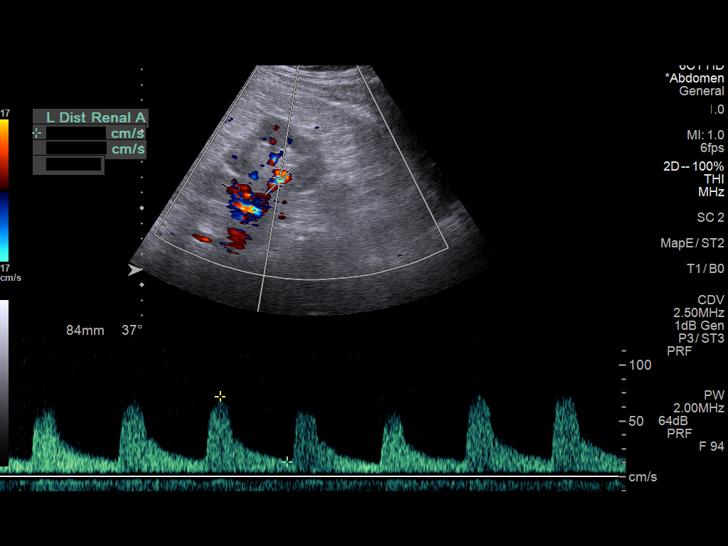
[im 78/85]
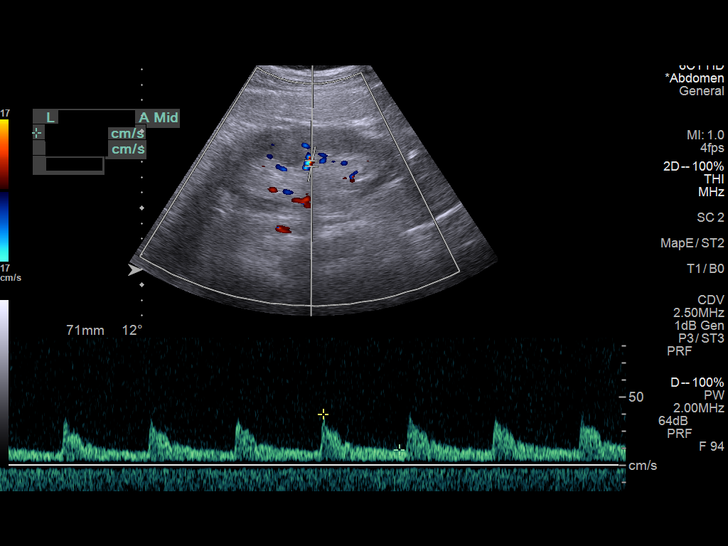
[im 85/85]
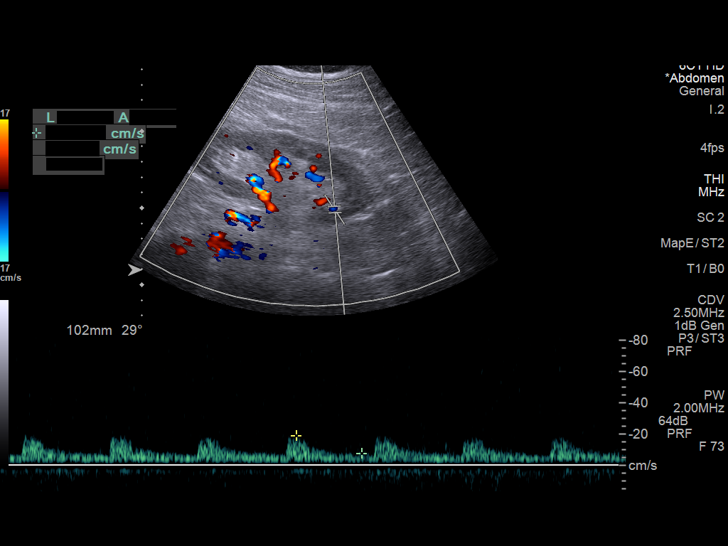

[13 of 25 positions shown; findings below may reference images not displayed]

FINDINGS: Right Kidney:

Length: 10.3 cm x 4.4 cm x 4.9 cm, 116 cc. Advanced renal cortical
thinning. No hydronephrosis. Anechoic cystic lesion inferior cortex
of the right kidney measuring 4.6 cm x 3.3 cm x 4.3 cm, compatible
with Bosniak 1 cyst.

Hypoechoic lesion on the superior cortex of the right kidney with
through transmission and some internal echoes measuring 1.4 cm x
cm x 0.9 cm. No internal color flow. This is most likely complex
cyst, though current study is nondiagnostic.

Left Kidney:

Length: 12.8 cm x 5.5 cm x 7.5 cm, 275 cc. Mild renal cortical
thinning, asymmetric to the left. No hydronephrosis.

Bladder:  Unremarkable

RENAL DUPLEX ULTRASOUND

Right Renal Artery Velocities:

Origin:  119 cm/sec

Mid:  43 cm/sec

Hilum:  48 cm/sec

Interlobar:  25 cm/sec

Arcuate:  19 cm/sec

Left Renal Artery Velocities:

Origin:  156 cm/sec

Mid:  280 cm/sec

Hilum:  95 cm/sec

Interlobar:  51 cm/sec

Arcuate:  31 cm/sec

Aortic Velocity:  129 cm/sec

Right Renal-Aortic Ratios:

Origin:

Mid:

Hilum:

Interlobar:

Arcuate:

Left Renal-Aortic Ratios:

Origin:

Mid:

Hilum:

Interlobar:

Arcuate:
IMPRESSION: Directed duplex of the right renal artery demonstrates no evidence
of high-grade stenosis.

Directed duplex of the left renal artery demonstrates a questionable
stenosis in the segmental artery to the left lower pole at the renal
hilum. A stenosis at this site would be difficult to confirm with MR
or CT imaging. If confirmatory testing was necessary, formal
angiogram would be suggested as the first test.

Right:

Advanced renal cortical thinning, compatible with chronic medical
renal disease.

There are 2 cystic lesions of the right kidney, the larger which
appears to represent a simple cyst, the smaller of which is
incompletely characterized.

Left:

Mild renal cortical thinning suggesting medical renal disease, not
as severe as the right.

## 2022-11-22 DIAGNOSIS — E7800 Pure hypercholesterolemia, unspecified: Secondary | ICD-10-CM | POA: Diagnosis not present

## 2022-11-22 DIAGNOSIS — R978 Other abnormal tumor markers: Secondary | ICD-10-CM | POA: Diagnosis not present

## 2022-11-22 DIAGNOSIS — I10 Essential (primary) hypertension: Secondary | ICD-10-CM | POA: Diagnosis not present

## 2022-11-25 DIAGNOSIS — N183 Chronic kidney disease, stage 3 unspecified: Secondary | ICD-10-CM | POA: Diagnosis not present

## 2022-11-25 DIAGNOSIS — N2581 Secondary hyperparathyroidism of renal origin: Secondary | ICD-10-CM | POA: Diagnosis not present

## 2022-11-25 DIAGNOSIS — R978 Other abnormal tumor markers: Secondary | ICD-10-CM | POA: Diagnosis not present

## 2022-11-25 DIAGNOSIS — E7800 Pure hypercholesterolemia, unspecified: Secondary | ICD-10-CM | POA: Diagnosis not present

## 2022-11-25 DIAGNOSIS — Z0000 Encounter for general adult medical examination without abnormal findings: Secondary | ICD-10-CM | POA: Diagnosis not present

## 2023-03-28 DIAGNOSIS — R978 Other abnormal tumor markers: Secondary | ICD-10-CM | POA: Diagnosis not present

## 2023-11-01 DIAGNOSIS — H524 Presbyopia: Secondary | ICD-10-CM | POA: Diagnosis not present

## 2023-11-01 DIAGNOSIS — H35371 Puckering of macula, right eye: Secondary | ICD-10-CM | POA: Diagnosis not present

## 2023-11-01 DIAGNOSIS — D3131 Benign neoplasm of right choroid: Secondary | ICD-10-CM | POA: Diagnosis not present

## 2023-11-01 DIAGNOSIS — Z961 Presence of intraocular lens: Secondary | ICD-10-CM | POA: Diagnosis not present

## 2023-11-01 DIAGNOSIS — H5213 Myopia, bilateral: Secondary | ICD-10-CM | POA: Diagnosis not present

## 2024-02-14 DIAGNOSIS — M791 Myalgia, unspecified site: Secondary | ICD-10-CM | POA: Diagnosis not present

## 2024-02-14 DIAGNOSIS — M545 Low back pain, unspecified: Secondary | ICD-10-CM | POA: Diagnosis not present

## 2024-02-14 DIAGNOSIS — M25552 Pain in left hip: Secondary | ICD-10-CM | POA: Diagnosis not present

## 2024-02-27 DIAGNOSIS — R978 Other abnormal tumor markers: Secondary | ICD-10-CM | POA: Diagnosis not present

## 2024-02-27 DIAGNOSIS — Z0000 Encounter for general adult medical examination without abnormal findings: Secondary | ICD-10-CM | POA: Diagnosis not present

## 2024-02-27 DIAGNOSIS — N2581 Secondary hyperparathyroidism of renal origin: Secondary | ICD-10-CM | POA: Diagnosis not present

## 2024-02-27 DIAGNOSIS — I10 Essential (primary) hypertension: Secondary | ICD-10-CM | POA: Diagnosis not present

## 2024-02-27 DIAGNOSIS — E7800 Pure hypercholesterolemia, unspecified: Secondary | ICD-10-CM | POA: Diagnosis not present

## 2024-04-10 DIAGNOSIS — M545 Low back pain, unspecified: Secondary | ICD-10-CM | POA: Diagnosis not present

## 2024-04-10 DIAGNOSIS — M791 Myalgia, unspecified site: Secondary | ICD-10-CM | POA: Diagnosis not present

## 2024-05-30 ENCOUNTER — Other Ambulatory Visit: Payer: Self-pay | Admitting: Urology

## 2024-05-30 DIAGNOSIS — R972 Elevated prostate specific antigen [PSA]: Secondary | ICD-10-CM

## 2024-07-12 ENCOUNTER — Ambulatory Visit
Admission: RE | Admit: 2024-07-12 | Discharge: 2024-07-12 | Disposition: A | Discharge status: Home / Self Care | Source: Ambulatory Visit | Attending: Urology | Admitting: Urology

## 2024-07-12 DIAGNOSIS — R972 Elevated prostate specific antigen [PSA]: Secondary | ICD-10-CM

## 2024-07-12 MED ORDER — GADOPICLENOL 0.5 MMOL/ML IV SOLN
10.0000 mL | Freq: Once | INTRAVENOUS | Status: AC | PRN
  Administered 2024-07-12: 10 mL via INTRAVENOUS

## 2024-09-17 ENCOUNTER — Telehealth: Payer: Self-pay | Admitting: Pharmacist

## 2024-09-17 NOTE — Progress Notes (Signed)
   09/17/2024  Patient ID: Darius Lane, Darius Lane   DOB: 1946-05-04, 78 y.o.   MRN: [id number]  Called and spoke with the patient on the phone today regarding medications. Advised they haven't been picked up in 60+ days.  Patient appeared on med adherence report for 2025:  Atorvastatin- need 276 covered/ 252 currently- due now  Amlodipine-olmesartan- need 276 covered/ 252 currently- due now   Said that they have moved away, but not completely. Will use [facility] for refills now currently. Called the pharmacy and ran his meds through. They will get started on them today. Advised he needs to get them over the next 10 days or they'll go back.  Confirmed understanding.    Aloysius Lewis, PharmD, [facility] Phone Number: [phone]
